# Patient Record
Sex: Female | Born: 1990 | Race: White | Hispanic: No | Marital: Single | Smoking: Never smoker
Health system: Southern US, Community
[De-identification: ages and names within clinical notes are randomized; demographics above are authoritative.]

## PROBLEM LIST (undated history)

## (undated) DIAGNOSIS — Z8739 Personal history of other diseases of the musculoskeletal system and connective tissue: Secondary | ICD-10-CM

## (undated) HISTORY — DX: Personal history of other diseases of the musculoskeletal system and connective tissue: Z87.39

---
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
## 2012-06-22 ENCOUNTER — Ambulatory Visit: Payer: Self-pay | Admitting: Family Medicine

## 2012-06-23 ENCOUNTER — Ambulatory Visit
Admission: RE | Admit: 2012-06-23 | Discharge: 2012-06-23 | Disposition: A | Discharge status: Home / Self Care | Payer: BC Managed Care – PPO | Source: Ambulatory Visit | Attending: Gastroenterology | Admitting: Gastroenterology

## 2012-06-23 ENCOUNTER — Other Ambulatory Visit: Payer: Self-pay | Admitting: Gastroenterology

## 2012-06-23 DIAGNOSIS — R109 Unspecified abdominal pain: Secondary | ICD-10-CM

## 2012-06-23 DIAGNOSIS — R197 Diarrhea, unspecified: Secondary | ICD-10-CM

## 2012-06-23 MED ORDER — IOHEXOL 300 MG/ML  SOLN
100.0000 mL | Freq: Once | INTRAMUSCULAR | Status: AC | PRN
  Administered 2012-06-23: 100 mL via INTRAVENOUS

## 2012-07-31 ENCOUNTER — Ambulatory Visit: Payer: Self-pay | Admitting: Internal Medicine

## 2012-11-02 ENCOUNTER — Emergency Department: Payer: Self-pay | Admitting: Emergency Medicine

## 2012-11-02 LAB — DRUG SCREEN, URINE
Amphetamines, Ur Screen: NEGATIVE (ref ?–1000)
Cannabinoid 50 Ng, Ur ~~LOC~~: POSITIVE (ref ?–50)
Cocaine Metabolite,Ur ~~LOC~~: NEGATIVE (ref ?–300)
Methadone, Ur Screen: NEGATIVE (ref ?–300)
Phencyclidine (PCP) Ur S: NEGATIVE (ref ?–25)
Tricyclic, Ur Screen: NEGATIVE (ref ?–1000)

## 2012-11-02 LAB — COMPREHENSIVE METABOLIC PANEL
Albumin: 3.6 g/dL — ABNORMAL LOW (ref 3.8–5.6)
BUN: 15 mg/dL (ref 7–18)
Calcium, Total: 8.6 mg/dL — ABNORMAL LOW (ref 9.0–10.7)
Chloride: 104 mmol/L (ref 98–107)
Co2: 26 mmol/L (ref 21–32)
Creatinine: 0.74 mg/dL (ref 0.60–1.30)
EGFR (Non-African Amer.): >60
Glucose: 111 mg/dL — ABNORMAL HIGH (ref 65–99)
Osmolality: 275 (ref 275–301)
Potassium: 3.9 mmol/L (ref 3.5–5.1)
SGPT (ALT): 20 U/L (ref 12–78)
Total Protein: 7.4 g/dL (ref 6.4–8.6)

## 2012-11-02 LAB — CBC WITH DIFFERENTIAL/PLATELET
Basophil %: 0.4 %
Eosinophil %: 1.8 %
HCT: 37.2 % (ref 35.0–47.0)
Lymphocyte %: 33 %
MCH: 30 pg (ref 26.0–34.0)
MCV: 89 fL (ref 80–100)
Monocyte #: 0.7 x10 3/mm (ref 0.2–0.9)
Monocyte %: 7 %
Neutrophil %: 57.8 %
WBC: 9.3 10*3/uL (ref 3.6–11.0)

## 2012-11-02 LAB — CK TOTAL AND CKMB (NOT AT ARMC)
CK, Total: 63 U/L (ref 21–215)
CK-MB: <0.5 ng/mL — ABNORMAL LOW (ref 0.5–3.6)

## 2012-11-02 LAB — URINALYSIS, COMPLETE
Blood: NEGATIVE
Ketone: NEGATIVE
Squamous Epithelial: 17

## 2013-06-12 ENCOUNTER — Ambulatory Visit: Payer: Self-pay | Admitting: Gastroenterology

## 2013-07-06 ENCOUNTER — Ambulatory Visit: Payer: Self-pay | Admitting: Gastroenterology

## 2013-11-13 IMAGING — CT CT ABD-PELV W/ CM
2 of 4 series · 17 of 46 positions shown, 19 images · IV contrast (omnipaque)
Comparison: None.

CLINICAL DATA: RLQ abdominal pain, diarrhea, evaluate for
appendicitis or colitis

CT ABDOMEN AND PELVIS WITH CONTRAST
TECHNIQUE: Multidetector CT imaging of the abdomen and pelvis was
performed following the standard protocol during bolus
administration of intravenous contrast.
Contrast: 100mL OMNIPAQUE IOHEXOL 300 MG/ML  SOLN

[Series 2: abd/pelvis with · axial · 0.70mm/px · z∈[-385,+5]mm · 14 of 85 slices shown, 16 images]
[im 4/85  soft-tissue]
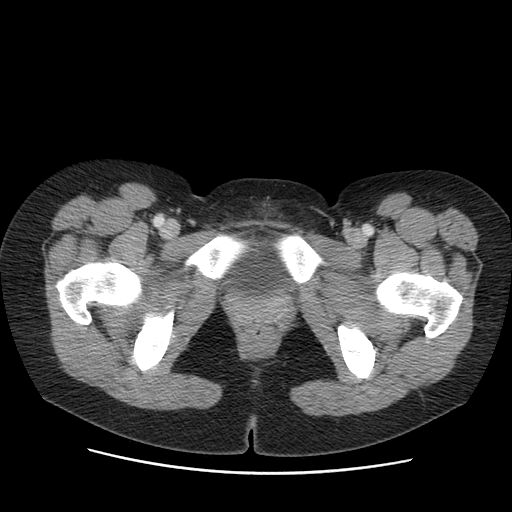
[im 4/85  bone]
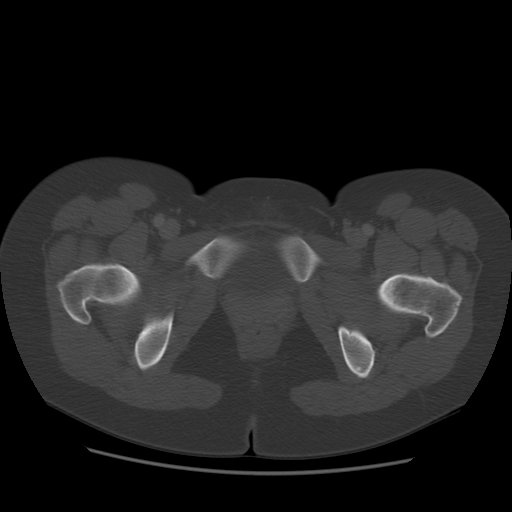
[im 11/85  soft-tissue]
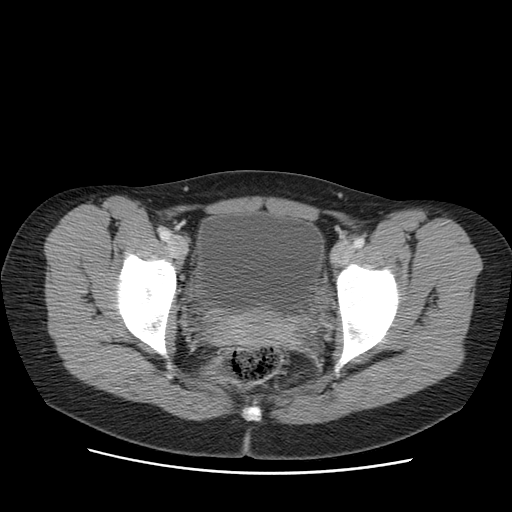
[im 18/85  soft-tissue]
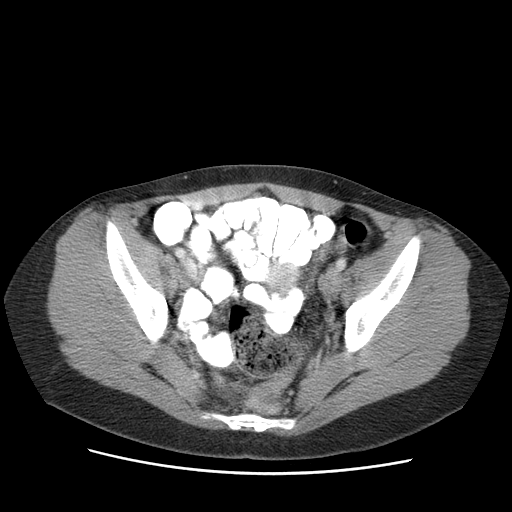
[im 22/85  soft-tissue]
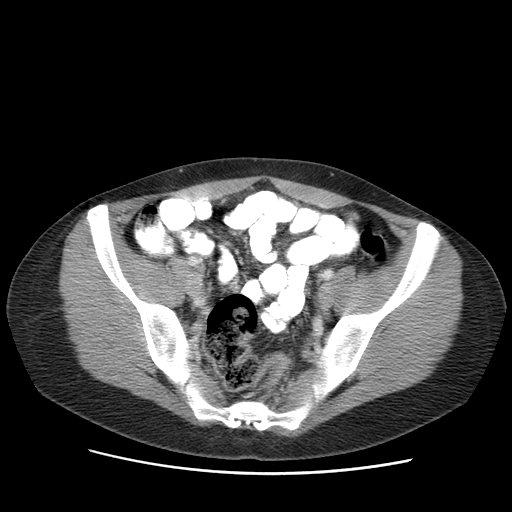
[im 29/85  soft-tissue]
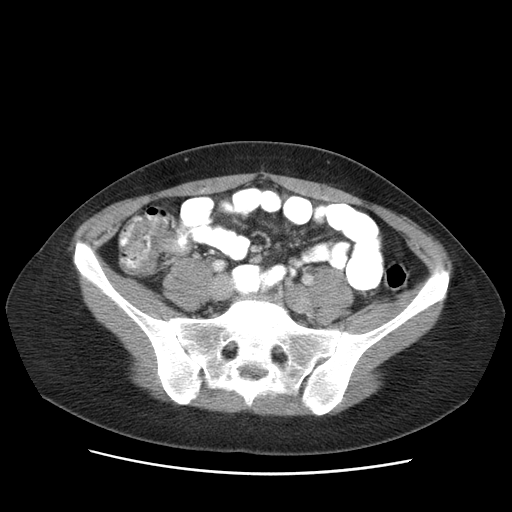
[im 36/85  soft-tissue]
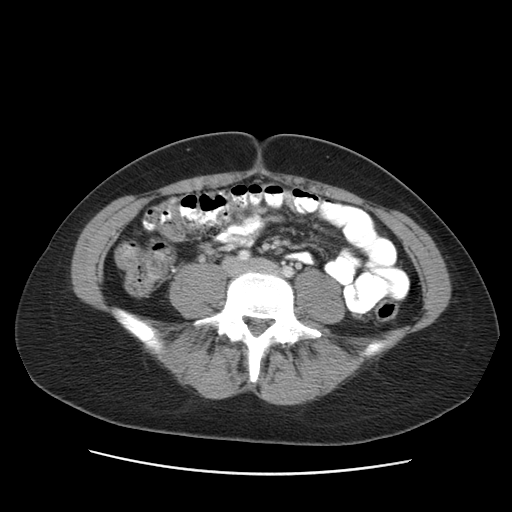
[im 39/85  soft-tissue]
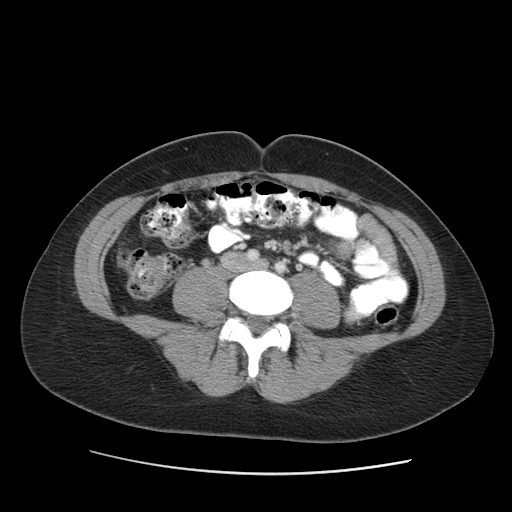
[im 46/85  soft-tissue]
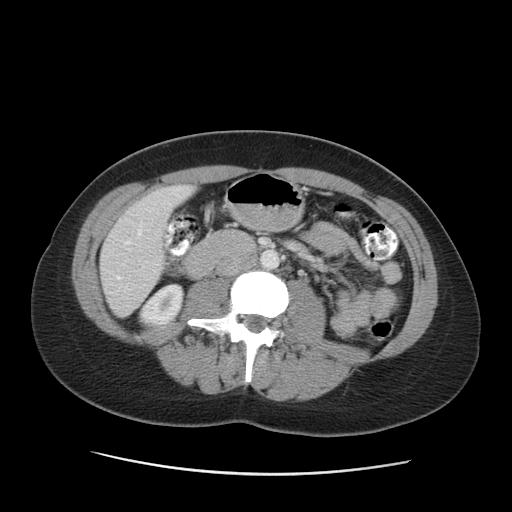
[im 50/85  soft-tissue]
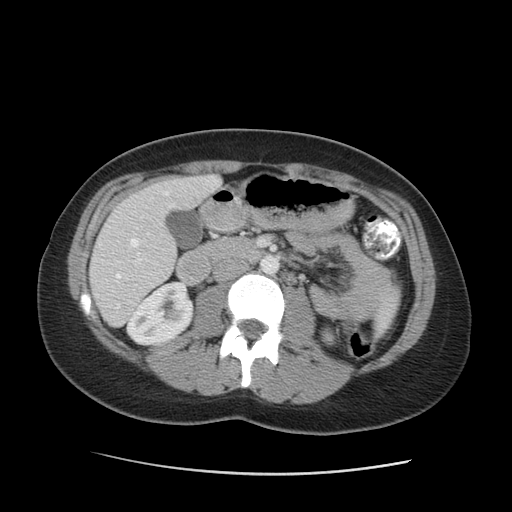
[im 50/85  bone]
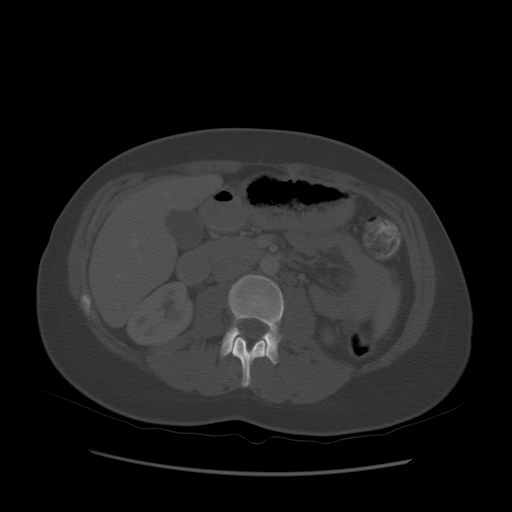
[im 57/85  soft-tissue]
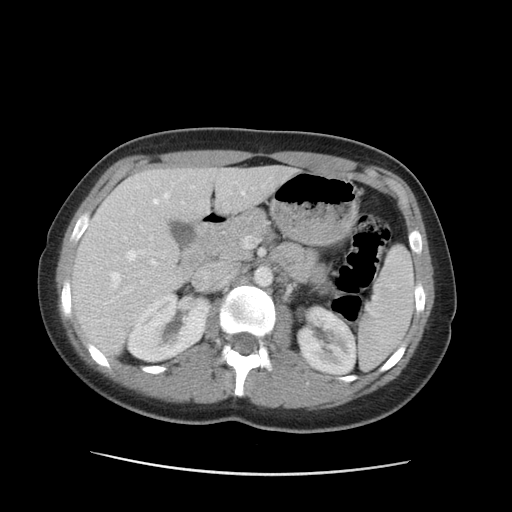
[im 64/85  soft-tissue]
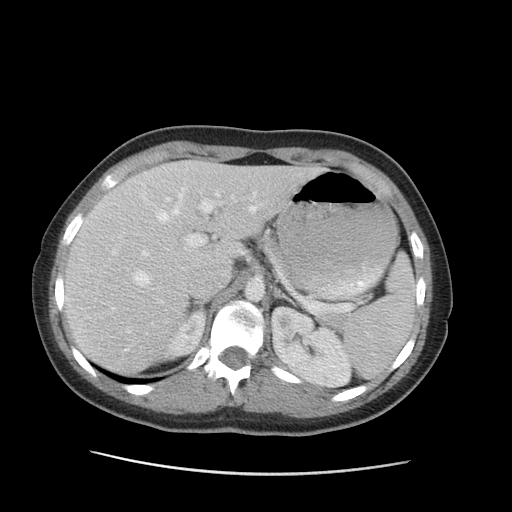
[im 67/85  soft-tissue]
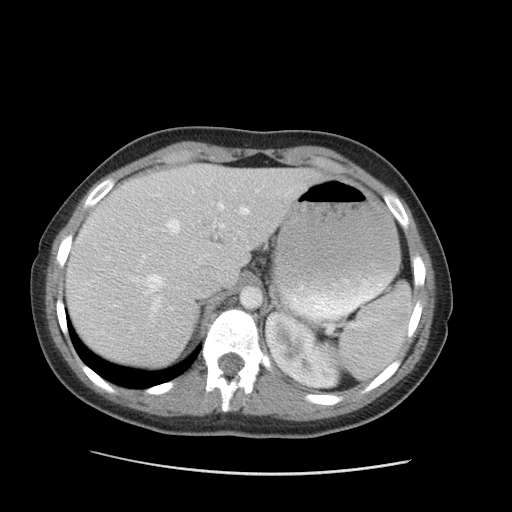
[im 74/85  soft-tissue]
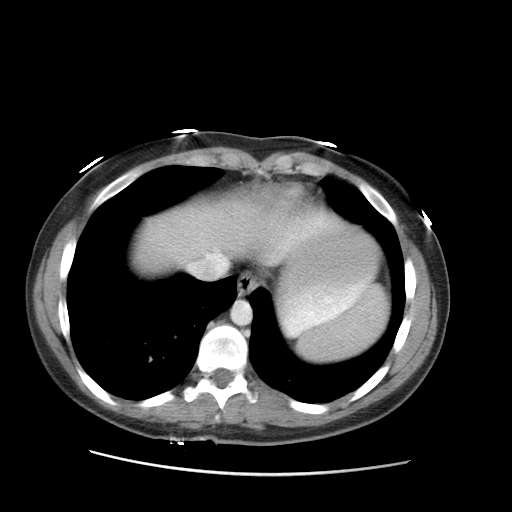
[im 81/85  soft-tissue]
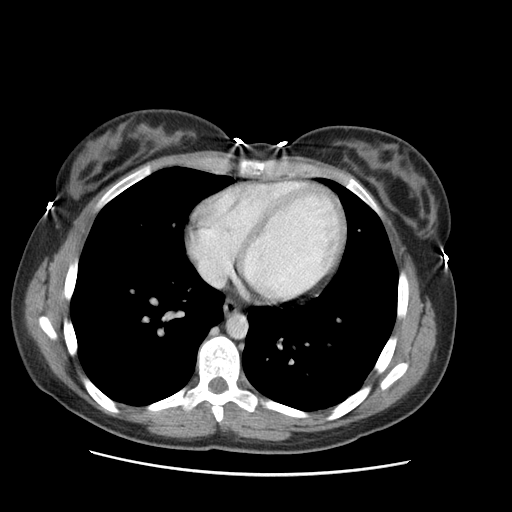

[Series 400: cor · coronal · 0.91mm/px · 3 of 104 slices shown]
[im 35/104  soft-tissue]
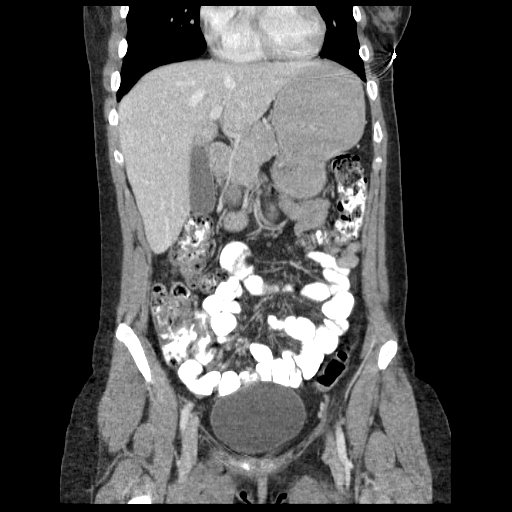
[im 46/104  soft-tissue]
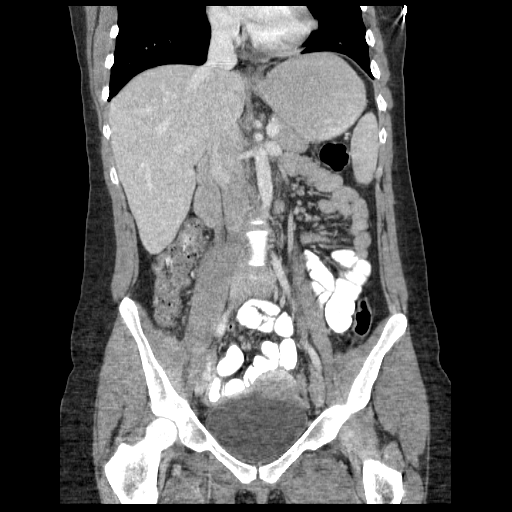
[im 58/104  soft-tissue]
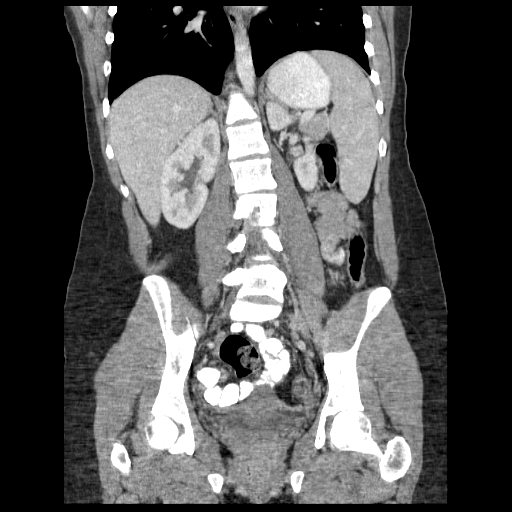

[17 of 46 positions shown; findings below may reference images not displayed]

FINDINGS: Lung bases are clear.

Liver, spleen, pancreas, and adrenal glands within normal limits.

Gallbladder is unremarkable.  No intrahepatic or extrahepatic
ductal dilatation.

Kidneys are within normal limits.  No hydronephrosis.

No evidence of bowel obstruction.  Normal appendix.  Terminal ileum
is mildly thick-walled (series 2/image 58).  No colonic wall
thickening or inflammatory changes.

No evidence of abdominal aortic aneurysm.

No abdominopelvic ascites.

Scattered small retroperitoneal/right lower quadrant mesenteric
lymph nodes measuring up to 8 mm short axis (series 2/image 52),
likely reactive.

Uterus and bilateral ovaries are unremarkable.

Bladder is within normal limits.

Visualized osseous structures are within normal limits.
IMPRESSION: Normal appendix.  No evidence of bowel obstruction.

Terminal ileum is mildly thick-walled, suggesting
infectious/inflammatory enteritis.

These results were called by telephone on 06/23/2012 at 4444 hours
to Ayisu Saley, who verbally acknowledged these results.

## 2013-11-20 ENCOUNTER — Ambulatory Visit (INDEPENDENT_AMBULATORY_CARE_PROVIDER_SITE_OTHER): Payer: PRIVATE HEALTH INSURANCE | Admitting: Internal Medicine

## 2013-11-20 ENCOUNTER — Encounter: Payer: Self-pay | Admitting: Internal Medicine

## 2013-11-20 VITALS — BP 112/74 | HR 90 | Temp 98.3°F | Ht 63.75 in | Wt 134.0 lb

## 2013-11-20 DIAGNOSIS — F32A Depression, unspecified: Secondary | ICD-10-CM | POA: Insufficient documentation

## 2013-11-20 DIAGNOSIS — F329 Major depressive disorder, single episode, unspecified: Secondary | ICD-10-CM | POA: Insufficient documentation

## 2013-11-20 DIAGNOSIS — Z0000 Encounter for general adult medical examination without abnormal findings: Secondary | ICD-10-CM

## 2013-11-20 DIAGNOSIS — F3289 Other specified depressive episodes: Secondary | ICD-10-CM

## 2013-11-20 DIAGNOSIS — F411 Generalized anxiety disorder: Secondary | ICD-10-CM

## 2013-11-20 LAB — CBC
HEMATOCRIT: 40 % (ref 36.0–46.0)
Hemoglobin: 13.2 g/dL (ref 12.0–15.0)
MCHC: 33 g/dL (ref 30.0–36.0)
MCV: 89.2 fl (ref 78.0–100.0)
Platelets: 276 10*3/uL (ref 150.0–400.0)
RBC: 4.49 Mil/uL (ref 3.87–5.11)
RDW: 12.9 % (ref 11.5–14.6)
WBC: 7.5 10*3/uL (ref 4.5–10.5)

## 2013-11-20 LAB — COMPREHENSIVE METABOLIC PANEL
ALBUMIN: 3.9 g/dL (ref 3.5–5.2)
ALK PHOS: 80 U/L (ref 39–117)
ALT: 13 U/L (ref 0–35)
AST: 18 U/L (ref 0–37)
BILIRUBIN TOTAL: 0.8 mg/dL (ref 0.3–1.2)
BUN: 11 mg/dL (ref 6–23)
CO2: 27 mEq/L (ref 19–32)
Calcium: 9 mg/dL (ref 8.4–10.5)
Chloride: 103 mEq/L (ref 96–112)
Creatinine, Ser: 0.8 mg/dL (ref 0.4–1.2)
GFR: 99.5 mL/min (ref >60.00)
Glucose, Bld: 80 mg/dL (ref 70–99)
POTASSIUM: 4.3 meq/L (ref 3.5–5.1)
SODIUM: 137 meq/L (ref 135–145)
TOTAL PROTEIN: 7.5 g/dL (ref 6.0–8.3)

## 2013-11-20 LAB — LIPID PANEL
Cholesterol: 146 mg/dL (ref 0–200)
HDL: 54.6 mg/dL (ref >39.00)
LDL CALC: 75 mg/dL (ref 0–99)
Total CHOL/HDL Ratio: 3
Triglycerides: 82 mg/dL (ref 0.0–149.0)
VLDL: 16.4 mg/dL (ref 0.0–40.0)

## 2013-11-20 LAB — TSH: TSH: 0.74 u[IU]/mL (ref 0.35–5.50)

## 2013-11-20 MED ORDER — ESCITALOPRAM OXALATE 10 MG PO TABS: 10.0000 mg | ORAL_TABLET | Freq: Every day | ORAL | Status: DC

## 2013-11-20 NOTE — Patient Instructions (Addendum)
Health Maintenance, 44- to 21-Year-Old SCHOOL PERFORMANCE After high school completion, the young adult may be attending college, Hotel manager or vocational school, or entering the [employer] or the work force. SOCIAL AND EMOTIONAL DEVELOPMENT The young adult establishes adult relationships and explores sexual identity. Young adults may be living at home or in a college dorm or apartment. Increasing independence is important with young adults. Throughout these years, young adults should assume responsibility of their own health care. RECOMMENDED IMMUNIZATIONS  Influenza vaccine.  All adults should be immunized every year.  All adults, including pregnant women and people with hives-only allergy to eggs can receive the inactivated influenza (IIV) vaccine.  Adults aged 44 49 years can receive the recombinant influenza (RIV) vaccine. The RIV vaccine does not contain any egg protein.  Tetanus, diphtheria, and acellular pertussis (Td, Tdap) vaccine.  Pregnant women should receive 1 dose of Tdap vaccine during each pregnancy. The dose should be obtained regardless of the length of time since the last dose. Immunization is preferred during the 27th to 36th week of gestation.  An adult who has not previously received Tdap or who does not know his or her vaccine status should receive 1 dose of Tdap. This initial dose should be followed by tetanus and diphtheria toxoids (Td) booster doses every 10 years.  Adults with an unknown or incomplete history of completing a 3-dose immunization series with Td-containing vaccines should begin or complete a primary immunization series including a Tdap dose.  Adults should receive a Td booster every 10 years.  Varicella vaccine.  An adult without evidence of immunity to varicella should receive 2 doses or a second dose if he or she has previously received 1 dose.  Pregnant females who do not have evidence of immunity should receive the first dose after pregnancy.  This first dose should be obtained before leaving the health care facility. The second dose should be obtained 4 8 weeks after the first dose.  Human papillomavirus (HPV) vaccine.  Females aged 15 26 years who have not received the vaccine previously should obtain the 3-dose series.  The vaccine is not recommended for use in pregnant females. However, pregnancy testing is not needed before receiving a dose. If a female is found to be pregnant after receiving a dose, no treatment is needed. In that case, the remaining doses should be delayed until after the pregnancy.  Males aged 12 21 years who have not received the vaccine previously should receive the 3-dose series. Males aged 39 26 years may be immunized.  Immunization is recommended through the age of 1 years for any female who has sex with males and did not get any or all doses earlier.  Immunization is recommended for any person with an immunocompromised condition through the age of 27 years if he or she did not get any or all doses earlier.  During the 3-dose series, the second dose should be obtained 4 8 weeks after the first dose. The third dose should be obtained 24 weeks after the first dose and 16 weeks after the second dose.  Measles, mumps, and rubella (MMR) vaccine.  Adults born in [birth year] or later should have 1 or more doses of MMR vaccine unless there is a contraindication to the vaccine or there is laboratory evidence of immunity to each of the three diseases.  A routine second dose of MMR vaccine should be obtained at least 28 days after the first dose for students attending postsecondary schools, health care workers, or international travelers.  For females of childbearing age, rubella immunity should be determined. If there is no evidence of immunity, females who are not pregnant should be vaccinated. If there is no evidence of immunity, females who are pregnant should delay immunization until after pregnancy.  Pneumococcal  13-valent conjugate (PCV13) vaccine.  When indicated, a person who is uncertain of his or her immunization history and has no record of immunization should receive the PCV13 vaccine.  An adult aged 19 years or older who has certain medical conditions and has not been previously immunized should receive 1 dose of PCV13 vaccine. This PCV13 should be followed with a dose of pneumococcal polysaccharide (PPSV23) vaccine. The PPSV23 vaccine dose should be obtained at least 8 weeks after the dose of PCV13 vaccine.  An adult aged 19 years or older who has certain medical conditions and previously received 1 or more doses of PPSV23 vaccine should receive 1 dose of PCV13. The PCV13 vaccine dose should be obtained 1 or more years after the last PPSV23 vaccine dose.  Pneumococcal polysaccharide (PPSV23) vaccine.  When PCV13 is also indicated, PCV13 should be obtained first.  An adult younger than age 65 years who has certain medical conditions should be immunized.  Any person who resides in a nursing home or long-term care facility should be immunized.  An adult smoker should be immunized.  People with an immunocompromised condition and certain other conditions should receive both PCV13 and PPSV23 vaccines.  People with human immunodeficiency virus (HIV) infection should be immunized as soon as possible after diagnosis.  Immunization during chemotherapy or radiation therapy should be avoided.  Routine use of PPSV23 vaccine is not recommended for American Indians, [place] Natives, or people younger than 65 years unless there are medical conditions that require PPSV23 vaccine.  When indicated, people who have unknown immunization and have no record of immunization should receive PPSV23 vaccine.  One-time revaccination 5 years after the first dose of PPSV23 is recommended for people aged 19 64 years who have chronic kidney failure, nephrotic syndrome, asplenia, or immunocompromised  conditions.  Meningococcal vaccine.  Adults with asplenia or persistent complement component deficiencies should receive 2 doses of quadrivalent meningococcal conjugate (MenACWY-D) vaccine. The doses should be obtained at least 2 months apart.  Microbiologists working with certain meningococcal bacteria, military recruits, people at risk during an outbreak, and people who travel to or live in countries with a high rate of meningitis should be immunized.  A first-year college student up through age 21 years who is living in a residence hall should receive a dose if he or she did not receive a dose on or after his or her 16th birthday.  Adults who have certain high-risk conditions should receive one or more doses of vaccine.  Hepatitis A vaccine.  Adults who wish to be protected from this disease, have certain high-risk conditions, work with hepatitis A-infected animals, work in hepatitis A research labs, or travel to or work in countries with a high rate of hepatitis A should be immunized.  Adults who were previously unvaccinated and who anticipate close contact with an international adoptee during the first 60 days after arrival in the [place] from a country with a high rate of hepatitis A should be immunized.  Hepatitis B vaccine.  Adults who wish to be protected from this disease, have certain high-risk conditions, may be exposed to blood or other infectious body fluids, are household contacts or sex partners of hepatitis B positive people, are clients or workers in   certain care facilities, or travel to or work in countries with a high rate of hepatitis B should be immunized.  Haemophilus influenzae type b (Hib) vaccine.  A previously unvaccinated person with asplenia or sickle cell disease or having a scheduled splenectomy should receive 1 dose of Hib vaccine.  Regardless of previous immunization, a recipient of a hematopoietic stem cell transplant should receive a 3-dose series 6  12 months after his or her successful transplant.  Hib vaccine is not recommended for adults with HIV infection. TESTING Annual screening for vision and hearing problems is recommended. Vision should be screened objectively at least once between 18 21 years of age. The young adult may be screened for anemia or tuberculosis. Young adults should have a blood test to check for high cholesterol during this time period. Young adults should be screened for use of alcohol and drugs. If the young adult is sexually active, screening for sexually transmitted infections, pregnancy, or HIV may be performed.  NUTRITION AND ORAL HEALTH  Adequate calcium intake is important. Consume 3 servings of low-fat milk and dairy products daily. For those who do not drink milk or consume dairy products, calcium enriched foods, such as juice, bread, or cereal, dark, leafy greens, or canned fish are alternate sources of calcium.  Drink plenty of water. Limit fruit juice to 8 12 ounces (240 360 mL) each day. Avoid sugary beverages or sodas.  Discourage skipping meals, especially breakfast. Young adults should eat a good variety of vegetables and fruits, as well as lean meats.  Avoid foods high in fat, salt, or sugar, such as candy, chips, and cookies.  Encourage young adults to participate in meal planning and preparation.  Eat meals together as a family whenever possible. Encourage conversation at mealtime.  Limit fast food choices and eating out at restaurants.  Brush teeth twice a day and floss.  Schedule dental exams twice a year. SLEEP Regular sleep habits are important. PHYSICAL, SOCIAL, AND EMOTIONAL DEVELOPMENT  One hour of regular physical activity daily is recommended. Continue to participate in sports.  Encourage young adults to develop their own interests and consider community service or volunteerism.  Provide guidance to the young adult in making decisions about college and work plans.  Make sure  that young adults know that they should never be in a situation that makes them uncomfortable, and they should tell partners if they do not want to engage in sexual activity.  Talk to the young adult about body image. Eating disorders may be noted at this time. Young adults may also be concerned about being overweight. Monitor the young adult for weight gain or loss.  Mood disturbances, depression, anxiety, alcoholism, or attention problems may be noted in young adults. Talk to the caregiver if there are concerns about mental illness.  Negotiate limit setting and independent decision making.  Encourage the young adult to handle conflict without physical violence.  Avoid loud noises which may impair hearing.  Limit television and computer time to 2 hours each day. Individuals who engage in excessive sedentary activity are more likely to become overweight. RISK BEHAVIORS  Sexually active young adults need to take precautions against pregnancy and sexually transmitted infections. Talk to young adults about contraception.  Provide a tobacco-free and drug-free environment for the young adult. Talk to the young adult about drug, tobacco, and alcohol use among friends or at friend's homes. Make sure the young adult knows that smoking tobacco or marijuana and taking drugs have health consequences and   may impact brain development.  Teach the young adult about appropriate use of over-the-counter or prescription medicines.  Establish guidelines for driving and for riding with friends.  Talk to young adults about the risks of drinking and driving or boating. Encourage the young adult to call you if he or she or friends have been drinking or using drugs.  Remind young adults to wear seat belts at all times in cars and life vests in boats.  Young adults should always wear a properly fitted helmet when they are riding a bicycle.  Use caution with all-terrain vehicles (ATVs) or other motorized  vehicles.  Do not keep handguns in the home. (If you do, the gun and ammunition should be locked separately and out of the young adult's access.)  Equip your home with smoke detectors and change the batteries regularly. Make sure all family members know the fire escape plans for your home.  Teach young adults not to swim alone and not to dive in shallow water.  All individuals should wear sunscreen when out in the sun. This minimizes sunburning. WHAT'S NEXT? Young adults should visit their pediatrician or family physician yearly. By young adulthood, health care should be transitioned to a family physician or internal medicine specialist. Sexually active females may want to begin annual physical exams with a gynecologist. Document Released: 12/03/2006 Document Revised: 01/02/2013 Document Reviewed: 12/23/2006 [employer] Patient Information 2014 [employer].  

## 2013-11-20 NOTE — Progress Notes (Signed)
HPI Pt presents to the clinic today to establish care. She has not had a PCP in the last 3 years since moving from [place]. She does have a history of IBS. She does have some concerns today about anxiety and depression. This has been a chronic problem for her in the past. She has never been on medicinal therapy but does go to counseling. She does feel like it is time to start medication. She denies SI/HI.  Flu:  Never Tetanus: 2012 LMP: 11/15/2013 Pap Smear: never Dentist: yearly  Past Medical History  Diagnosis Date  . History of Kawasaki's disease     No current outpatient prescriptions on file.   No current facility-administered medications for this visit.    Not on File  Family History  Problem Relation Age of Onset  . Depression Mother   . Hyperlipidemia Father   . Alzheimer's disease Paternal Grandmother   . Heart disease Paternal Grandfather   . Cancer Neg Hx   . Stroke Neg Hx     History   Social History  . Marital Status: Single    Spouse Name: N/A    Number of Children: N/A  . Years of Education: N/A   Occupational History  . Not on file.   Social History Main Topics  . Smoking status: Never Smoker   . Smokeless tobacco: Never Used  . Alcohol Use: 0.6 oz/week    1 Glasses of wine per week     Comment: occasional  . Drug Use: Yes     Comment: marijuana  . Sexual Activity: No   Other Topics Concern  . Not on file   Social History Narrative  . No narrative on file    ROS:  Constitutional: Denies fever, malaise, fatigue, headache or abrupt weight changes.  HEENT: Denies eye pain, eye redness, ear pain, ringing in the ears, wax buildup, runny nose, nasal congestion, bloody nose, or sore throat. Respiratory: Denies difficulty breathing, shortness of breath, cough or sputum production.   Cardiovascular: Denies chest pain, chest tightness, palpitations or swelling in the hands or feet.  Gastrointestinal: Pt reports diarrhea. Denies abdominal  pain, bloating, constipation, or blood in the stool.  GU: Denies frequency, urgency, pain with urination, blood in urine, odor or discharge. Musculoskeletal: Denies decrease in range of motion, difficulty with gait, muscle pain or joint pain and swelling.  Skin: Denies redness, rashes, lesions or ulcercations.  Neurological: Denies dizziness, difficulty with memory, difficulty with speech or problems with balance and coordination.  Psych: Pt reports anxiety and depression.  Denies SI/HI.  No other specific complaints in a complete review of systems (except as listed in HPI above).  PE:  BP 112/74  Pulse 90  Temp(Src) 98.3 F (36.8 C) (Oral)  Ht 5' 3.75" (1.619 m)  Wt 134 lb (60.782 kg)  BMI 23.19 kg/m2  SpO2 99%  LMP 11/15/2013 Wt Readings from Last 3 Encounters:  11/20/13 134 lb (60.782 kg)    General: Appears her stated age, well developed, well nourished in NAD. HEENT: Head: normal shape and size; Eyes: sclera white, no icterus, conjunctiva pink, PERRLA and EOMs intact; Ears: Tm's gray and intact, normal light reflex; Nose: mucosa pink and moist, septum midline; Throat/Mouth: Teeth present, mucosa pink and moist, no lesions or ulcerations noted.  Neck: Normal range of motion. Neck supple, trachea midline. No massses, lumps or thyromegaly present.  Cardiovascular: Normal rate and rhythm. S1,S2 noted.  No murmur, rubs or gallops noted. No JVD or BLE edema. No  carotid bruits noted. Pulmonary/Chest: Normal effort and positive vesicular breath sounds. No respiratory distress. No wheezes, rales or ronchi noted.  Abdomen: Soft and nontender. Normal bowel sounds, no bruits noted. No distention or masses noted. Liver, spleen and kidneys non palpable. Musculoskeletal: Normal range of motion. No signs of joint swelling. No difficulty with gait.  Neurological: Alert and oriented. Cranial nerves II-XII intact. Coordination normal. +DTRs bilaterally. Psychiatric: Mood anxious and affect normal.  Behavior is normal. Judgment and thought content normal.      Assessment and Plan:  Prevent Health Maintenance:  Pt declines flu shot today Will obtain screening labs today

## 2013-11-20 NOTE — Progress Notes (Signed)
Pre visit review using our clinic review tool, if applicable. No additional management support is needed unless otherwise documented below in the visit note. 

## 2013-11-20 NOTE — Assessment & Plan Note (Signed)
Will start Lexapro 10 mg daily Continue counseling  RTC in 1 month to reassess medication effectiveness 

## 2013-11-20 NOTE — Assessment & Plan Note (Signed)
Will start Lexapro 10 mg daily Continue counseling  RTC in 1 month to reassess medication effectiveness

## 2013-11-30 ENCOUNTER — Telehealth: Payer: Self-pay | Admitting: Internal Medicine

## 2013-11-30 NOTE — Telephone Encounter (Signed)
Ramond DialNathan Blake (pt therapist) called requesting to speak to you.

## 2013-11-30 NOTE — Telephone Encounter (Signed)
Spoke with Ramond DialNathan Blake, school therapist. He believes the pt would benefit from short term benzo use to get her through her panic attacks while lexapro is getting in her system. I agree. Please call in xanax 0.5 mg q12H prn panic attacks. # 30, 0 refills

## 2013-11-30 NOTE — Telephone Encounter (Signed)
Rx called into pharmacy and Revonda Standardllison told pt I was doing so

## 2013-12-05 ENCOUNTER — Ambulatory Visit (INDEPENDENT_AMBULATORY_CARE_PROVIDER_SITE_OTHER): Payer: PRIVATE HEALTH INSURANCE | Admitting: Internal Medicine

## 2013-12-05 ENCOUNTER — Encounter: Payer: Self-pay | Admitting: Internal Medicine

## 2013-12-05 VITALS — BP 108/70 | HR 71 | Temp 98.0°F | Wt 132.8 lb

## 2013-12-05 DIAGNOSIS — F411 Generalized anxiety disorder: Secondary | ICD-10-CM

## 2013-12-05 DIAGNOSIS — F32A Depression, unspecified: Secondary | ICD-10-CM

## 2013-12-05 DIAGNOSIS — F329 Major depressive disorder, single episode, unspecified: Secondary | ICD-10-CM

## 2013-12-05 DIAGNOSIS — F3289 Other specified depressive episodes: Secondary | ICD-10-CM

## 2013-12-05 MED ORDER — ESCITALOPRAM OXALATE 20 MG PO TABS: 20.0000 mg | ORAL_TABLET | Freq: Every day | ORAL | Status: DC

## 2013-12-05 NOTE — Assessment & Plan Note (Signed)
Will increase Lexapro to 20 mg daily Continue xanax prn Follow up with your therapist  RTC in 2 weeks to reassess

## 2013-12-05 NOTE — Progress Notes (Signed)
Pre visit review using our clinic review tool, if applicable. No additional management support is needed unless otherwise documented below in the visit note. 

## 2013-12-05 NOTE — Progress Notes (Signed)
Subjective:    Patient ID: Alison Ramirez, female    DOB: 31-Jan-1991, 23 y.o.   MRN: [id number]  HPI  Pt presents to the clinic today to follow up anxiety and depression. She was started on lexapro 2 weeks ago. She had met with her therapist 1 week ago and was having a severe panic attack. He called me and suggested starting her on a low dose benzo until the lexapro got in her system. She reports that the xanax helps be she does not think the lexapro is effective. She has had 3 panic attacks in the last week, sometimes even occuring in the middle of the night.  Review of Systems      Past Medical History  Diagnosis Date  . History of Kawasaki's disease     Current Outpatient Prescriptions  Medication Sig Dispense Refill  . ALPRAZolam (XANAX) 0.5 MG tablet Take 0.5 mg by mouth 2 (two) times daily as needed for anxiety.      . norgestimate-ethinyl estradiol (ORTHO-CYCLEN,SPRINTEC,PREVIFEM) 0.25-35 MG-MCG tablet Take 1 tablet by mouth daily.      Marland Kitchen escitalopram (LEXAPRO) 20 MG tablet Take 1 tablet (20 mg total) by mouth daily.  30 tablet  0   No current facility-administered medications for this visit.    No Known Allergies  Family History  Problem Relation Age of Onset  . Depression Mother   . Hyperlipidemia Father   . Alzheimer's disease Paternal Grandmother   . Heart disease Paternal Grandfather   . Cancer Neg Hx   . Stroke Neg Hx     History   Social History  . Marital Status: Single    Spouse Name: N/A    Number of Children: N/A  . Years of Education: N/A   Occupational History  . Not on file.   Social History Main Topics  . Smoking status: Never Smoker   . Smokeless tobacco: Never Used  . Alcohol Use: 0.6 oz/week    1 Glasses of wine per week     Comment: occasional  . Drug Use: Yes     Comment: marijuana  . Sexual Activity: No   Other Topics Concern  . Not on file   Social History Narrative  . No narrative on file     Constitutional: Denies fever,  malaise, fatigue, headache or abrupt weight changes.  Psych: Pt reports anxiety and depression. Denies SI/HI.  No other specific complaints in a complete review of systems (except as listed in HPI above).  Objective:   Physical Exam   BP 108/70  Pulse 71  Temp(Src) 98 F (36.7 C) (Oral)  Wt 132 lb 12 oz (60.215 kg)  SpO2 98%  LMP 11/15/2013 Wt Readings from Last 3 Encounters:  12/05/13 132 lb 12 oz (60.215 kg)  11/20/13 134 lb (60.782 kg)    General: Appears her stated age, well developed, well nourished in NAD. Cardiovascular: Normal rate and rhythm. S1,S2 noted.  No murmur, rubs or gallops noted. No JVD or BLE edema. No carotid bruits noted. Pulmonary/Chest: Normal effort and positive vesicular breath sounds. No respiratory distress. No wheezes, rales or ronchi noted.  Psychiatric: Mood and affect normal. Behavior is normal. Judgment and thought content normal.     BMET    Component Value Date/Time   NA 137 11/20/2013 1352   K 4.3 11/20/2013 1352   CL 103 11/20/2013 1352   CO2 27 11/20/2013 1352   GLUCOSE 80 11/20/2013 1352   BUN 11 11/20/2013 1352   CREATININE  0.8 11/20/2013 1352   CALCIUM 9.0 11/20/2013 1352    Lipid Panel     Component Value Date/Time   CHOL 146 11/20/2013 1352   TRIG 82.0 11/20/2013 1352   HDL 54.60 11/20/2013 1352   CHOLHDL 3 11/20/2013 1352   VLDL 16.4 11/20/2013 1352   LDLCALC 75 11/20/2013 1352    CBC    Component Value Date/Time   WBC 7.5 11/20/2013 1352   RBC 4.49 11/20/2013 1352   HGB 13.2 11/20/2013 1352   HCT 40.0 11/20/2013 1352   PLT 276.0 11/20/2013 1352   MCV 89.2 11/20/2013 1352   MCHC 33.0 11/20/2013 1352   RDW 12.9 11/20/2013 1352    Hgb A1C No results found for this basename: HGBA1C        Assessment & Plan:

## 2013-12-05 NOTE — Patient Instructions (Addendum)

## 2013-12-05 NOTE — Assessment & Plan Note (Signed)
Will increase Lexapro to 20 mg daily Continue xanax prn Follow up with your therapist  RTC in 2 weeks to reassess 

## 2013-12-25 ENCOUNTER — Ambulatory Visit (INDEPENDENT_AMBULATORY_CARE_PROVIDER_SITE_OTHER): Payer: PRIVATE HEALTH INSURANCE | Admitting: Internal Medicine

## 2013-12-25 ENCOUNTER — Encounter: Payer: Self-pay | Admitting: Internal Medicine

## 2013-12-25 VITALS — BP 108/66 | HR 75 | Temp 98.3°F | Wt 135.0 lb

## 2013-12-25 DIAGNOSIS — F411 Generalized anxiety disorder: Secondary | ICD-10-CM

## 2013-12-25 NOTE — Assessment & Plan Note (Signed)
Still anxious on Lexapro and Xanax ? If she needs to be tested for ADD- her trigger is mainly revolved around school and school work She meets with her therapist tomorrow  If no significant improvement on Lexapro when she finishes her bottle, will switch to prozac Will not increase xanax

## 2013-12-25 NOTE — Progress Notes (Signed)
Subjective:    Patient ID: Alison Ramirez, female    DOB: [birth year], 23 y.o.   MRN: [id number]  HPI  Pt ;presents to the clinic today for 1 month f/u anxiety. She was started on lexapro 12/05/13. 2 weeks later, I received a call from her therapist regarding her having severe panic attacks, 3 times in 1 week. He suggested a low dose benzo. She was started on Xanax 0.5 mg and her Lexapro was increased to 20 mg daily. Since that time, she reports that she continues to have panic attacks, her last one was last week. She reports the xanax does help a little but not enough. She thinks the lexapro is starting to work a little as well. She does meet with her therapist next week.  Review of Systems      Past Medical History  Diagnosis Date  . History of Kawasaki's disease     Current Outpatient Prescriptions  Medication Sig Dispense Refill  . ALPRAZolam (XANAX) 0.5 MG tablet Take 0.5 mg by mouth 2 (two) times daily as needed for anxiety.      Marland Kitchen escitalopram (LEXAPRO) 20 MG tablet Take 1 tablet (20 mg total) by mouth daily.  30 tablet  0  . norgestimate-ethinyl estradiol (ORTHO-CYCLEN,SPRINTEC,PREVIFEM) 0.25-35 MG-MCG tablet Take 1 tablet by mouth daily.       No current facility-administered medications for this visit.    No Known Allergies  Family History  Problem Relation Age of Onset  . Depression Mother   . Hyperlipidemia Father   . Alzheimer's disease Paternal Grandmother   . Heart disease Paternal Grandfather   . Cancer Neg Hx   . Stroke Neg Hx     History   Social History  . Marital Status: Single    Spouse Name: N/A    Number of Children: N/A  . Years of Education: N/A   Occupational History  . Not on file.   Social History Main Topics  . Smoking status: Never Smoker   . Smokeless tobacco: Never Used  . Alcohol Use: 0.6 oz/week    1 Glasses of wine per week     Comment: occasional  . Drug Use: Yes     Comment: marijuana  . Sexual Activity: No   Other Topics  Concern  . Not on file   Social History Narrative  . No narrative on file     Constitutional: Denies fever, malaise, fatigue, headache or abrupt weight changes.  Psych: Pt reports anxiety and depression. Denies SI/HI.  No other specific complaints in a complete review of systems (except as listed in HPI above).  Objective:   Physical Exam    BP 108/66  Pulse 75  Temp(Src) 98.3 F (36.8 C) (Oral)  Wt 135 lb (61.236 kg)  SpO2 99% Wt Readings from Last 3 Encounters:  12/25/13 135 lb (61.236 kg)  12/05/13 132 lb 12 oz (60.215 kg)  11/20/13 134 lb (60.782 kg)    General: Appears her stated age, well developed, well nourished in NAD. Cardiovascular: Normal rate and rhythm. S1,S2 noted.  No murmur, rubs or gallops noted. No JVD or BLE edema. No carotid bruits noted. Pulmonary/Chest: Normal effort and positive vesicular breath sounds. No respiratory distress. No wheezes, rales or ronchi noted.  Psychiatric: Mood anxious and affect normal. Behavior is normal. Judgment and thought content normal.     BMET    Component Value Date/Time   NA 137 11/20/2013 1352   K 4.3 11/20/2013 1352   CL 103  11/20/2013 1352   CO2 27 11/20/2013 1352   GLUCOSE 80 11/20/2013 1352   BUN 11 11/20/2013 1352   CREATININE 0.8 11/20/2013 1352   CALCIUM 9.0 11/20/2013 1352    Lipid Panel     Component Value Date/Time   CHOL 146 11/20/2013 1352   TRIG 82.0 11/20/2013 1352   HDL 54.60 11/20/2013 1352   CHOLHDL 3 11/20/2013 1352   VLDL 16.4 11/20/2013 1352   LDLCALC 75 11/20/2013 1352    CBC    Component Value Date/Time   WBC 7.5 11/20/2013 1352   RBC 4.49 11/20/2013 1352   HGB 13.2 11/20/2013 1352   HCT 40.0 11/20/2013 1352   PLT 276.0 11/20/2013 1352   MCV 89.2 11/20/2013 1352   MCHC 33.0 11/20/2013 1352   RDW 12.9 11/20/2013 1352    Hgb A1C No results found for this basename: HGBA1C       Assessment & Plan:

## 2013-12-25 NOTE — Patient Instructions (Addendum)

## 2013-12-25 NOTE — Progress Notes (Signed)
Pre visit review using our clinic review tool, if applicable. No additional management support is needed unless otherwise documented below in the visit note. 

## 2014-01-01 ENCOUNTER — Telehealth: Payer: Self-pay | Admitting: *Deleted

## 2014-01-01 DIAGNOSIS — F411 Generalized anxiety disorder: Secondary | ICD-10-CM

## 2014-01-01 MED ORDER — ESCITALOPRAM OXALATE 20 MG PO TABS: 20.0000 mg | ORAL_TABLET | Freq: Every day | ORAL | Status: DC

## 2014-01-01 MED ORDER — FLUOXETINE HCL 20 MG PO CAPS: 20.0000 mg | ORAL_CAPSULE | Freq: Every day | ORAL | Status: AC

## 2014-01-01 NOTE — Telephone Encounter (Signed)
Rx sent to pharmacy as instructed--Both

## 2014-01-01 NOTE — Telephone Encounter (Signed)
Please send RX for prozac 20 mg daily, # 30 2 refills. D/c lexapro. Ok to send rx for 3 day supply of lexapro as well.

## 2014-01-01 NOTE — Telephone Encounter (Signed)
Pharmacy faxed a refill request for lexapro, pt ran out over weekend so they gave her a 3 days supply. Recent ov mentioned switching her to Prozac once she finishes lexapro. Can you send rx for new med? Is it ok to also send one for the 3 day supply pharmacy gave her?

## 2014-02-01 ENCOUNTER — Encounter: Payer: Self-pay | Admitting: Internal Medicine

## 2014-02-01 ENCOUNTER — Ambulatory Visit (INDEPENDENT_AMBULATORY_CARE_PROVIDER_SITE_OTHER): Payer: PRIVATE HEALTH INSURANCE | Admitting: Internal Medicine

## 2014-02-01 VITALS — BP 104/66 | HR 88 | Temp 98.8°F | Wt 136.5 lb

## 2014-02-01 DIAGNOSIS — F341 Dysthymic disorder: Secondary | ICD-10-CM

## 2014-02-01 DIAGNOSIS — F502 Bulimia nervosa: Secondary | ICD-10-CM

## 2014-02-01 DIAGNOSIS — F329 Major depressive disorder, single episode, unspecified: Secondary | ICD-10-CM

## 2014-02-01 DIAGNOSIS — F419 Anxiety disorder, unspecified: Principal | ICD-10-CM

## 2014-02-01 NOTE — Progress Notes (Signed)
Subjective:    Patient ID: Alison Ramirez, female    DOB: 10/04/1990, 23 y.o.   MRN: [id number]  HPI  Pt presents to the clinic today to follow up GAD/depression. She recently failed Lexapro and was started on Prozac x 2 weeks ago. She also started a 12 day taper of 60 mg of Prednisone 2 days after starting the Lexapro for Tendonitis in her elbow, prescribed to her by Urgent Care. She reports in the past 2 weeks her depression is a 8/10. She is crying every day, over eating, self-isolating from social events, having difficulty sleeping, difficulty making decisions, and self-criticizing. Her bulimia has also started back during this time. She admits to passive SI with thoughts of "why am I here" but does not feel that she would ever hurt herself. She feels "numb and can't feel anything". She is working with a Paramedictherapist, Pharmacologistpastoral counselor at [employer], I did ask her to inquire about ADD testing. Her therapist thinks she may have a underlying OCD and not ADD. She denies HI.   Review of Systems  Past Medical History  Diagnosis Date  . History of Kawasaki's disease     Current Outpatient Prescriptions  Medication Sig Dispense Refill  . ALPRAZolam (XANAX) 0.5 MG tablet Take 0.5 mg by mouth 2 (two) times daily as needed for anxiety.      Marland Kitchen. FLUoxetine (PROZAC) 20 MG capsule Take 1 capsule (20 mg total) by mouth daily.  30 capsule  2  . meloxicam (MOBIC) 15 MG tablet Take 15 mg by mouth daily.      . norgestimate-ethinyl estradiol (ORTHO-CYCLEN,SPRINTEC,PREVIFEM) 0.25-35 MG-MCG tablet Take 1 tablet by mouth daily.       No current facility-administered medications for this visit.    No Known Allergies  Family History  Problem Relation Age of Onset  . Depression Mother   . Hyperlipidemia Father   . Alzheimer's disease Paternal Grandmother   . Heart disease Paternal Grandfather   . Cancer Neg Hx   . Stroke Neg Hx     History   Social History  . Marital Status: Single    Spouse Name: N/A   Number of Children: N/A  . Years of Education: N/A   Occupational History  . Not on file.   Social History Main Topics  . Smoking status: Never Smoker   . Smokeless tobacco: Never Used  . Alcohol Use: 0.6 oz/week    1 Glasses of wine per week     Comment: occasional  . Drug Use: Yes     Comment: marijuana  . Sexual Activity: No   Other Topics Concern  . Not on file   Social History Narrative  . No narrative on file     Constitutional: Denies fever, malaise, fatigue, headache or abrupt weight changes.  Psych: Pt reports anxiety and depression. Denies SI/HI.  No other specific complaints in a complete review of systems (except as listed in HPI above).     Objective:   Physical Exam   BP 104/66  Pulse 88  Temp(Src) 98.8 F (37.1 C) (Oral)  Wt 136 lb 8 oz (61.916 kg)  SpO2 98% Wt Readings from Last 3 Encounters:  02/01/14 136 lb 8 oz (61.916 kg)  12/25/13 135 lb (61.236 kg)  12/05/13 132 lb 12 oz (60.215 kg)    General: Appears her stated age, well developed, well nourished in NAD. Cardiovascular: Normal rate and rhythm. S1,S2 noted.  No murmur, rubs or gallops noted. No JVD or BLE  edema. No carotid bruits noted. Pulmonary/Chest: Normal effort and positive vesicular breath sounds. No respiratory distress. No wheezes, rales or ronchi noted.  Psychiatric: Mood anxious and affect normal. Behavior is normal. Judgment and thought content normal.     BMET    Component Value Date/Time   NA 137 11/20/2013 1352   K 4.3 11/20/2013 1352   CL 103 11/20/2013 1352   CO2 27 11/20/2013 1352   GLUCOSE 80 11/20/2013 1352   BUN 11 11/20/2013 1352   CREATININE 0.8 11/20/2013 1352   CALCIUM 9.0 11/20/2013 1352    Lipid Panel     Component Value Date/Time   CHOL 146 11/20/2013 1352   TRIG 82.0 11/20/2013 1352   HDL 54.60 11/20/2013 1352   CHOLHDL 3 11/20/2013 1352   VLDL 16.4 11/20/2013 1352   LDLCALC 75 11/20/2013 1352    CBC    Component Value Date/Time   WBC 7.5 11/20/2013 1352   RBC 4.49  11/20/2013 1352   HGB 13.2 11/20/2013 1352   HCT 40.0 11/20/2013 1352   PLT 276.0 11/20/2013 1352   MCV 89.2 11/20/2013 1352   MCHC 33.0 11/20/2013 1352   RDW 12.9 11/20/2013 1352    Hgb A1C No results found for this basename: HGBA1C        Assessment & Plan:   Anxiety, depression and bulimia:  I don't think there is much more I can do for her Will set her up for psychiatry for further evaluation No red flags today Gave her a number for suicide hotline  Will follow up after she sees psychiatry

## 2014-02-01 NOTE — Progress Notes (Signed)
Pre visit review using our clinic review tool, if applicable. No additional management support is needed unless otherwise documented below in the visit note. 

## 2014-02-01 NOTE — Patient Instructions (Addendum)

## 2014-07-08 ENCOUNTER — Emergency Department: Payer: Self-pay | Admitting: Emergency Medicine

## 2015-01-17 ENCOUNTER — Emergency Department
Admit: 2015-01-17 | Disposition: A | Discharge status: Home / Self Care | Payer: Self-pay | Admitting: Emergency Medicine

## 2015-01-18 LAB — COMPREHENSIVE METABOLIC PANEL
ALBUMIN: 4.1 g/dL
Alkaline Phosphatase: 87 U/L
Anion Gap: 5 — ABNORMAL LOW (ref 7–16)
BILIRUBIN TOTAL: 0.5 mg/dL
BUN: 17 mg/dL
Calcium, Total: 8.7 mg/dL — ABNORMAL LOW
Chloride: 106 mmol/L
Co2: 27 mmol/L
Creatinine: 0.64 mg/dL
Glucose: 118 mg/dL — ABNORMAL HIGH
Potassium: 3.9 mmol/L
SGOT(AST): 21 U/L
SGPT (ALT): 17 U/L
Sodium: 138 mmol/L
TOTAL PROTEIN: 7.2 g/dL

## 2015-01-18 LAB — URINALYSIS, COMPLETE
BILIRUBIN, UR: NEGATIVE
GLUCOSE, UR: NEGATIVE mg/dL (ref 0–75)
KETONE: NEGATIVE
LEUKOCYTE ESTERASE: NEGATIVE
Nitrite: NEGATIVE
Ph: 5 (ref 4.5–8.0)
Protein: 30
Specific Gravity: 1.036 (ref 1.003–1.030)

## 2015-01-18 LAB — CBC WITH DIFFERENTIAL/PLATELET
BASOS PCT: 0.6 %
Basophil #: 0 10*3/uL (ref 0.0–0.1)
Eosinophil #: 0.1 10*3/uL (ref 0.0–0.7)
Eosinophil %: 1.7 %
HCT: 38.6 % (ref 35.0–47.0)
HGB: 13 g/dL (ref 12.0–16.0)
LYMPHS PCT: 43.2 %
Lymphocyte #: 2.6 10*3/uL (ref 1.0–3.6)
MCH: 31.1 pg (ref 26.0–34.0)
MCHC: 33.6 g/dL (ref 32.0–36.0)
MCV: 93 fL (ref 80–100)
MONOS PCT: 6.4 %
Monocyte #: 0.4 x10 3/mm (ref 0.2–0.9)
NEUTROS ABS: 2.9 10*3/uL (ref 1.4–6.5)
Neutrophil %: 48.1 %
Platelet: 214 10*3/uL (ref 150–440)
RBC: 4.17 10*6/uL (ref 3.80–5.20)
RDW: 12.8 % (ref 11.5–14.5)
WBC: 6 10*3/uL (ref 3.6–11.0)

## 2015-01-18 LAB — LIPASE, BLOOD: Lipase: 39 U/L

## 2015-01-18 LAB — TROPONIN I

## 2015-11-15 ENCOUNTER — Telehealth: Payer: Self-pay | Admitting: Obstetrics and Gynecology

## 2015-11-15 DIAGNOSIS — Z30011 Encounter for initial prescription of contraceptive pills: Secondary | ICD-10-CM

## 2015-11-15 MED ORDER — NORGESTIMATE-ETH ESTRADIOL 0.25-35 MG-MCG PO TABS: 1.0000 | ORAL_TABLET | Freq: Every day | ORAL | Status: AC

## 2015-11-15 NOTE — Telephone Encounter (Signed)
Called pt she states that she had 2 IUDs placed last year, both of which her body rejected and came out. Pt states that she believes the IUDs did not work for her due to her recent dx of H. Pylori. Pt states that she now has her condition under control, and would like to resume contraception. Advised pt to take UPT (even though she denies being sexually active) RX sent in for Ortho-Cyclen. Advised pt on the need for annual or follow up appt.

## 2015-11-15 NOTE — Telephone Encounter (Signed)
She currently is not on [facility], she had to take a break after getting off of the IUD b/c your body rejected it. Now is ready to get back on [facility]. She wants to try [facility] pills, she used to taker orthocylcin... [facility] [address]

## 2016-02-01 ENCOUNTER — Encounter (HOSPITAL_COMMUNITY): Payer: Self-pay | Admitting: Emergency Medicine

## 2016-02-01 ENCOUNTER — Emergency Department (HOSPITAL_COMMUNITY)
Admission: EM | Admit: 2016-02-01 | Discharge: 2016-02-01 | Disposition: A | Discharge status: Home / Self Care | Payer: 59 | Attending: Emergency Medicine | Admitting: Emergency Medicine

## 2016-02-01 DIAGNOSIS — F1010 Alcohol abuse, uncomplicated: Secondary | ICD-10-CM | POA: Insufficient documentation

## 2016-02-01 DIAGNOSIS — Z793 Long term (current) use of hormonal contraceptives: Secondary | ICD-10-CM | POA: Insufficient documentation

## 2016-02-01 DIAGNOSIS — Z791 Long term (current) use of non-steroidal anti-inflammatories (NSAID): Secondary | ICD-10-CM | POA: Diagnosis not present

## 2016-02-01 DIAGNOSIS — Z8679 Personal history of other diseases of the circulatory system: Secondary | ICD-10-CM | POA: Insufficient documentation

## 2016-02-01 DIAGNOSIS — R112 Nausea with vomiting, unspecified: Secondary | ICD-10-CM | POA: Insufficient documentation

## 2016-02-01 DIAGNOSIS — Z79899 Other long term (current) drug therapy: Secondary | ICD-10-CM | POA: Insufficient documentation

## 2016-02-01 LAB — CBC
HEMATOCRIT: 36.3 % (ref 36.0–46.0)
Hemoglobin: 11.3 g/dL — ABNORMAL LOW (ref 12.0–15.0)
MCH: 24.7 pg — ABNORMAL LOW (ref 26.0–34.0)
MCHC: 31.1 g/dL (ref 30.0–36.0)
MCV: 79.3 fL (ref 78.0–100.0)
Platelets: 365 10*3/uL (ref 150–400)
RBC: 4.58 MIL/uL (ref 3.87–5.11)
RDW: 16.7 % — ABNORMAL HIGH (ref 11.5–15.5)
WBC: 7.6 10*3/uL (ref 4.0–10.5)

## 2016-02-01 LAB — COMPREHENSIVE METABOLIC PANEL
ALK PHOS: 92 U/L (ref 38–126)
ALT: 25 U/L (ref 14–54)
ANION GAP: 13 (ref 5–15)
AST: 37 U/L (ref 15–41)
Albumin: 3.8 g/dL (ref 3.5–5.0)
BILIRUBIN TOTAL: 0.6 mg/dL (ref 0.3–1.2)
BUN: 12 mg/dL (ref 6–20)
CALCIUM: 8.9 mg/dL (ref 8.9–10.3)
CO2: 21 mmol/L — ABNORMAL LOW (ref 22–32)
Chloride: 103 mmol/L (ref 101–111)
Creatinine, Ser: 0.74 mg/dL (ref 0.44–1.00)
GFR calc Af Amer: >60 mL/min (ref >60)
Glucose, Bld: 118 mg/dL — ABNORMAL HIGH (ref 65–99)
POTASSIUM: 3.5 mmol/L (ref 3.5–5.1)
Sodium: 137 mmol/L (ref 135–145)
Total Protein: 7.6 g/dL (ref 6.5–8.1)

## 2016-02-01 LAB — URINALYSIS, ROUTINE W REFLEX MICROSCOPIC
BILIRUBIN URINE: NEGATIVE
Glucose, UA: NEGATIVE mg/dL
Hgb urine dipstick: NEGATIVE
Ketones, ur: NEGATIVE mg/dL
LEUKOCYTES UA: NEGATIVE
NITRITE: NEGATIVE
PH: 8.5 — AB (ref 5.0–8.0)
PROTEIN: 100 mg/dL — AB
Specific Gravity, Urine: 1.02 (ref 1.005–1.030)

## 2016-02-01 LAB — I-STAT BETA HCG BLOOD, ED (MC, WL, AP ONLY): I-stat hCG, quantitative: <5 m[IU]/mL (ref <5)

## 2016-02-01 LAB — URINE MICROSCOPIC-ADD ON

## 2016-02-01 LAB — LIPASE, BLOOD: Lipase: 23 U/L (ref 11–51)

## 2016-02-01 MED ORDER — ONDANSETRON 4 MG PO TBDP
ORAL_TABLET | ORAL | Status: AC
  Filled 2016-02-01: qty 1

## 2016-02-01 MED ORDER — ONDANSETRON HCL 4 MG PO TABS: 4.0000 mg | ORAL_TABLET | Freq: Three times a day (TID) | ORAL | Status: AC | PRN

## 2016-02-01 MED ORDER — ONDANSETRON 4 MG PO TBDP
4.0000 mg | ORAL_TABLET | Freq: Once | ORAL | Status: AC | PRN
  Administered 2016-02-01: 4 mg via ORAL

## 2016-02-01 MED ORDER — SODIUM CHLORIDE 0.9 % IV BOLUS (SEPSIS)
2000.0000 mL | Freq: Once | INTRAVENOUS | Status: AC
  Administered 2016-02-01: 2000 mL via INTRAVENOUS

## 2016-02-01 MED ORDER — ONDANSETRON HCL 4 MG/2ML IJ SOLN
4.0000 mg | Freq: Once | INTRAMUSCULAR | Status: AC
  Administered 2016-02-01: 4 mg via INTRAVENOUS
  Filled 2016-02-01: qty 2

## 2016-02-01 NOTE — ED Notes (Signed)
MD at bedside. 
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
## 2016-04-02 ENCOUNTER — Other Ambulatory Visit: Payer: Self-pay | Admitting: Gastroenterology

## 2016-04-02 DIAGNOSIS — R1084 Generalized abdominal pain: Secondary | ICD-10-CM

## 2016-04-02 DIAGNOSIS — R198 Other specified symptoms and signs involving the digestive system and abdomen: Secondary | ICD-10-CM

## 2016-04-13 ENCOUNTER — Ambulatory Visit: Payer: PRIVATE HEALTH INSURANCE

## 2016-04-20 ENCOUNTER — Ambulatory Visit: Payer: 59

## 2016-05-26 ENCOUNTER — Ambulatory Visit: Admission: RE | Admit: 2016-05-26 | Payer: 59 | Source: Ambulatory Visit | Admitting: Gastroenterology

## 2016-05-26 ENCOUNTER — Encounter: Admission: RE | Payer: Self-pay | Source: Ambulatory Visit

## 2016-05-26 SURGERY — COLONOSCOPY
Anesthesia: General
